# Patient Record
Sex: Male | Born: 1990 | Race: Black or African American | Hispanic: No | Marital: Single | Smoking: Former smoker
Health system: Southern US, Community
[De-identification: ages and names within clinical notes are randomized; demographics above are authoritative.]

## PROBLEM LIST (undated history)

## (undated) DIAGNOSIS — S62316A Displaced fracture of base of fifth metacarpal bone, right hand, initial encounter for closed fracture: Secondary | ICD-10-CM

## (undated) DIAGNOSIS — Z789 Other specified health status: Secondary | ICD-10-CM

## (undated) HISTORY — PX: NO PAST SURGERIES: SHX2092

---
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
## 2010-11-06 LAB — GC/CHLAMYDIA PROBE AMP, GENITAL
Chlamydia, DNA Probe: NEGATIVE
GC Probe Amp, Genital: NEGATIVE

## 2010-11-06 LAB — RPR: RPR Ser Ql: NONREACTIVE

## 2010-11-08 LAB — RAPID STREP SCREEN (MED CTR MEBANE ONLY): Streptococcus, Group A Screen (Direct): POSITIVE — AB

## 2018-02-15 ENCOUNTER — Other Ambulatory Visit: Payer: Self-pay

## 2018-02-15 ENCOUNTER — Encounter (HOSPITAL_BASED_OUTPATIENT_CLINIC_OR_DEPARTMENT_OTHER): Payer: Self-pay | Admitting: Emergency Medicine

## 2018-02-15 ENCOUNTER — Emergency Department (HOSPITAL_BASED_OUTPATIENT_CLINIC_OR_DEPARTMENT_OTHER)
Admission: EM | Admit: 2018-02-15 | Discharge: 2018-02-15 | Disposition: A | Discharge status: Home / Self Care | Payer: Self-pay | Attending: Emergency Medicine | Admitting: Emergency Medicine

## 2018-02-15 ENCOUNTER — Emergency Department (HOSPITAL_BASED_OUTPATIENT_CLINIC_OR_DEPARTMENT_OTHER): Payer: Self-pay

## 2018-02-15 DIAGNOSIS — Y929 Unspecified place or not applicable: Secondary | ICD-10-CM | POA: Insufficient documentation

## 2018-02-15 DIAGNOSIS — W2209XA Striking against other stationary object, initial encounter: Secondary | ICD-10-CM | POA: Insufficient documentation

## 2018-02-15 DIAGNOSIS — S62336A Displaced fracture of neck of fifth metacarpal bone, right hand, initial encounter for closed fracture: Secondary | ICD-10-CM | POA: Insufficient documentation

## 2018-02-15 DIAGNOSIS — Y939 Activity, unspecified: Secondary | ICD-10-CM | POA: Insufficient documentation

## 2018-02-15 DIAGNOSIS — Y999 Unspecified external cause status: Secondary | ICD-10-CM | POA: Insufficient documentation

## 2018-02-15 MED ORDER — HYDROCODONE-ACETAMINOPHEN 5-325 MG PO TABS: 1.0000 | ORAL_TABLET | Freq: Four times a day (QID) | ORAL | 0 refills | Status: DC | PRN

## 2018-02-15 MED ORDER — HYDROCODONE-ACETAMINOPHEN 5-325 MG PO TABS
1.0000 | ORAL_TABLET | Freq: Once | ORAL | Status: AC
  Administered 2018-02-15: 1 via ORAL
  Filled 2018-02-15: qty 1

## 2018-02-15 MED ORDER — ONDANSETRON 8 MG PO TBDP
8.0000 mg | ORAL_TABLET | Freq: Once | ORAL | Status: AC
Start: 2018-02-15 — End: 2018-02-15
  Administered 2018-02-15: 8 mg via ORAL
  Filled 2018-02-15: qty 1

## 2018-02-15 MED ORDER — NAPROXEN 250 MG PO TABS
500.0000 mg | ORAL_TABLET | Freq: Once | ORAL | Status: AC
  Administered 2018-02-15: 500 mg via ORAL
  Filled 2018-02-15: qty 2

## 2018-02-15 NOTE — ED Notes (Signed)
PMS intact before and after. Pt tolerated well. All questions answered. 
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
## 2018-02-15 NOTE — ED Notes (Signed)
Patient transported to X-ray 

## 2018-02-15 NOTE — ED Provider Notes (Signed)
[facility] Provider Note: Larry Le Larry Macintyre, MD, FACEP  CSN: [id number] MRN: [id number] ARRIVAL: 02/15/18 at 0528 ROOM: [facility]07/[facility]07   CHIEF COMPLAINT  Hand Injury   HISTORY OF PRESENT ILLNESS  02/15/18 5:36 AM Larry Le Larry Le is a 28 y.o. male who punched a door in anger about an hour ago.  He is now having severe pain about his right fourth and fifth fifth metacarpals with overlying swelling and a superficial abrasion.  He is unable to move his right fourth and fifth fingers due to pain but sensation remains intact.  He denies other injuries.  He states his tetanus is up-to-date.   History reviewed. No pertinent past medical history.  History reviewed. No pertinent surgical history.  No family history on file.  Social History   Tobacco Use  . Smoking status: Not on file  Substance Use Topics  . Alcohol use: Not on file  . Drug use: Not on file    Prior to Admission medications   Medication Sig Start Date End Date Taking? Authorizing Provider  HYDROcodone-acetaminophen (NORCO) 5-325 MG tablet Take 1 tablet by mouth every 6 (six) hours as needed (for pain). 02/15/18   Naftuli Dalsanto, MD    Allergies Penicillins   REVIEW OF SYSTEMS  Negative except as noted here or in the History of Present Illness.   PHYSICAL EXAMINATION  Initial Vital Signs Height 5\' 11"  (1.803 m), weight 104.3 kg, SpO2 99 %.  Examination General: Well-developed, well-nourished male in no acute distress; appearance consistent with age of record HENT: normocephalic; atraumatic Eyes: Normal appearance Neck: supple Heart: regular rate and rhythm Lungs: clear to auscultation bilaterally Abdomen: soft; nondistended; nontender; bowel sounds present Extremities: Swelling and tenderness overlying the distal right fourth and fifth metacarpals with decreased range of motion of the right fourth and fifth fingers, fingers neurovascularly intact distally Neurologic: Awake, alert and oriented; motor  function intact in all extremities and symmetric; no facial droop Skin: Warm and dry; superficial abrasion overlying distal right fifth metacarpal Psychiatric: Normal mood and affect   RESULTS  Summary of this visit's results, reviewed by myself:   EKG Interpretation  Date/Time:    Ventricular Rate:    PR Interval:    QRS Duration:   QT Interval:    QTC Calculation:   R Axis:     Text Interpretation:        Laboratory Studies: No results found for this or any previous visit (from the past 24 hour(s)). Imaging Studies: Dg Hand Complete Right  Result Date: 02/15/2018 CLINICAL DATA:  Punched metal door EXAM: RIGHT HAND - COMPLETE 3+ VIEW COMPARISON:  None. FINDINGS: There is an oblique fracture of the distal fifth metacarpal with volar angulation. Minimal displacement. Moderate soft tissue swelling. IMPRESSION: Oblique fracture of the distal fifth metacarpal with volar angulation. Electronically Signed   By: Deatra RobinsonKevin  Herman M.D.   On: 02/15/2018 05:59    ED COURSE and MDM  Nursing notes and initial vitals signs, including pulse oximetry, reviewed.  Vitals:   02/15/18 0529 02/15/18 0531 02/15/18 0535  BP:   107/72  Pulse:   89  Resp:   18  Temp:   99.2 F (37.3 C)  TempSrc:   Oral  SpO2: 99%  100%  Weight:  104.3 kg   Height:  5\' 11"  (1.803 m)    Patient placed in right ulnar gutter splint.  Will refer to hand surgery for definitive treatment.  PROCEDURES    ED DIAGNOSES  ICD-10-CM   1. Closed displaced fracture of neck of fifth metacarpal bone of right hand, initial encounter S62.336A        Darleny Sem, Jonny RuizJohn, MD 02/15/18 [phone]

## 2018-02-15 NOTE — ED Notes (Signed)
Pt understood dc material. NAD Noted. Script sent to 24 hour pharmacy electronically.

## 2018-02-17 ENCOUNTER — Other Ambulatory Visit: Payer: Self-pay | Admitting: Orthopedic Surgery

## 2018-02-17 ENCOUNTER — Encounter (HOSPITAL_BASED_OUTPATIENT_CLINIC_OR_DEPARTMENT_OTHER): Payer: Self-pay | Admitting: *Deleted

## 2018-02-17 ENCOUNTER — Other Ambulatory Visit: Payer: Self-pay

## 2018-02-17 NOTE — Progress Notes (Signed)
Spoke with Dois Npo after midnight arrive 915 am 02-18-18 [facility] to take sip of water: hydrocodone prn Patient aware driver needed and will arrange driver Surgery odrers need 2nd sign in epic No labs needed

## 2018-02-18 ENCOUNTER — Encounter (HOSPITAL_BASED_OUTPATIENT_CLINIC_OR_DEPARTMENT_OTHER): Payer: Self-pay | Admitting: *Deleted

## 2018-02-18 ENCOUNTER — Ambulatory Visit (HOSPITAL_BASED_OUTPATIENT_CLINIC_OR_DEPARTMENT_OTHER): Payer: Self-pay | Admitting: Anesthesiology

## 2018-02-18 ENCOUNTER — Encounter (HOSPITAL_BASED_OUTPATIENT_CLINIC_OR_DEPARTMENT_OTHER)
Admission: RE | Disposition: A | Discharge status: Home / Self Care | Payer: Self-pay | Source: Home / Self Care | Attending: Orthopedic Surgery

## 2018-02-18 ENCOUNTER — Ambulatory Visit (HOSPITAL_BASED_OUTPATIENT_CLINIC_OR_DEPARTMENT_OTHER)
Admission: RE | Admit: 2018-02-18 | Discharge: 2018-02-18 | Disposition: A | Discharge status: Home / Self Care | Payer: Self-pay | Attending: Orthopedic Surgery | Admitting: Orthopedic Surgery

## 2018-02-18 ENCOUNTER — Other Ambulatory Visit: Payer: Self-pay

## 2018-02-18 DIAGNOSIS — X58XXXA Exposure to other specified factors, initial encounter: Secondary | ICD-10-CM | POA: Insufficient documentation

## 2018-02-18 DIAGNOSIS — Z880 Allergy status to penicillin: Secondary | ICD-10-CM | POA: Insufficient documentation

## 2018-02-18 DIAGNOSIS — S62316A Displaced fracture of base of fifth metacarpal bone, right hand, initial encounter for closed fracture: Secondary | ICD-10-CM | POA: Insufficient documentation

## 2018-02-18 HISTORY — DX: Displaced fracture of base of fifth metacarpal bone, right hand, initial encounter for closed fracture: S62.316A

## 2018-02-18 HISTORY — PX: CLOSED REDUCTION METACARPAL WITH PERCUTANEOUS PINNING: SHX5613

## 2018-02-18 HISTORY — DX: Other specified health status: Z78.9

## 2018-02-18 SURGERY — CLOSED REDUCTION, FRACTURE, METACARPAL BONE, WITH PERCUTANEOUS PINNING
Anesthesia: General | Laterality: Right

## 2018-02-18 MED ORDER — OXYCODONE HCL 5 MG PO TABS
ORAL_TABLET | ORAL | Status: AC
  Filled 2018-02-18: qty 1

## 2018-02-18 MED ORDER — FENTANYL CITRATE (PF) 100 MCG/2ML IJ SOLN
25.0000 ug | INTRAMUSCULAR | Status: DC | PRN
  Filled 2018-02-18: qty 1

## 2018-02-18 MED ORDER — PROPOFOL 10 MG/ML IV BOLUS
INTRAVENOUS | Status: DC | PRN
  Administered 2018-02-18: 200 mg via INTRAVENOUS
  Administered 2018-02-18: 50 mg via INTRAVENOUS

## 2018-02-18 MED ORDER — OXYCODONE-ACETAMINOPHEN 5-325 MG PO TABS: 1.0000 | ORAL_TABLET | ORAL | 0 refills | Status: AC | PRN

## 2018-02-18 MED ORDER — MIDAZOLAM HCL 2 MG/2ML IJ SOLN
INTRAMUSCULAR | Status: AC
  Filled 2018-02-18: qty 2

## 2018-02-18 MED ORDER — CHLORHEXIDINE GLUCONATE 4 % EX LIQD
60.0000 mL | Freq: Once | CUTANEOUS | Status: DC
  Filled 2018-02-18: qty 118

## 2018-02-18 MED ORDER — DEXAMETHASONE SODIUM PHOSPHATE 10 MG/ML IJ SOLN
INTRAMUSCULAR | Status: DC | PRN
  Administered 2018-02-18: 10 mg via INTRAVENOUS

## 2018-02-18 MED ORDER — LACTATED RINGERS IV SOLN
INTRAVENOUS | Status: DC
  Administered 2018-02-18 (×2): via INTRAVENOUS
  Filled 2018-02-18: qty 1000

## 2018-02-18 MED ORDER — MIDAZOLAM HCL 2 MG/2ML IJ SOLN
INTRAMUSCULAR | Status: DC | PRN
  Administered 2018-02-18: 2 mg via INTRAVENOUS

## 2018-02-18 MED ORDER — LIDOCAINE 2% (20 MG/ML) 5 ML SYRINGE
INTRAMUSCULAR | Status: DC | PRN
  Administered 2018-02-18: 60 mg via INTRAVENOUS

## 2018-02-18 MED ORDER — PROMETHAZINE HCL 25 MG/ML IJ SOLN
6.2500 mg | INTRAMUSCULAR | Status: DC | PRN
  Filled 2018-02-18: qty 1

## 2018-02-18 MED ORDER — KETOROLAC TROMETHAMINE 30 MG/ML IJ SOLN
30.0000 mg | Freq: Once | INTRAMUSCULAR | Status: DC | PRN
  Filled 2018-02-18: qty 1

## 2018-02-18 MED ORDER — KETOROLAC TROMETHAMINE 30 MG/ML IJ SOLN
INTRAMUSCULAR | Status: AC
  Filled 2018-02-18: qty 1

## 2018-02-18 MED ORDER — OXYCODONE HCL 5 MG PO TABS
5.0000 mg | ORAL_TABLET | Freq: Once | ORAL | Status: AC
  Administered 2018-02-18: 5 mg via ORAL
  Filled 2018-02-18: qty 1

## 2018-02-18 MED ORDER — VANCOMYCIN HCL IN DEXTROSE 1-5 GM/200ML-% IV SOLN
INTRAVENOUS | Status: AC
  Filled 2018-02-18: qty 200

## 2018-02-18 MED ORDER — FENTANYL CITRATE (PF) 100 MCG/2ML IJ SOLN
INTRAMUSCULAR | Status: AC
  Filled 2018-02-18: qty 2

## 2018-02-18 MED ORDER — KETOROLAC TROMETHAMINE 30 MG/ML IJ SOLN
INTRAMUSCULAR | Status: DC | PRN
  Administered 2018-02-18: 30 mg via INTRAVENOUS

## 2018-02-18 MED ORDER — ONDANSETRON HCL 4 MG/2ML IJ SOLN
INTRAMUSCULAR | Status: DC | PRN
  Administered 2018-02-18: 4 mg via INTRAVENOUS

## 2018-02-18 MED ORDER — PROPOFOL 10 MG/ML IV BOLUS
INTRAVENOUS | Status: AC
  Filled 2018-02-18: qty 40

## 2018-02-18 MED ORDER — VANCOMYCIN HCL IN DEXTROSE 1-5 GM/200ML-% IV SOLN
1000.0000 mg | INTRAVENOUS | Status: DC
  Filled 2018-02-18: qty 200

## 2018-02-18 MED ORDER — ONDANSETRON HCL 4 MG/2ML IJ SOLN
INTRAMUSCULAR | Status: AC
  Filled 2018-02-18: qty 2

## 2018-02-18 MED ORDER — FENTANYL CITRATE (PF) 100 MCG/2ML IJ SOLN
INTRAMUSCULAR | Status: DC | PRN
  Administered 2018-02-18 (×2): 50 ug via INTRAVENOUS
  Administered 2018-02-18 (×4): 25 ug via INTRAVENOUS

## 2018-02-18 MED ORDER — BUPIVACAINE HCL (PF) 0.25 % IJ SOLN
INTRAMUSCULAR | Status: DC | PRN
  Administered 2018-02-18: 10 mL

## 2018-02-18 SURGICAL SUPPLY — 70 items
2.0 DRILL BIT ×2 IMPLANT
APL SKNCLS STERI-STRIP NONHPOA (GAUZE/BANDAGES/DRESSINGS)
ARTHREX GUIDEWIRE ×1 IMPLANT
BANDAGE ACE 3X5.8 VEL STRL LF (GAUZE/BANDAGES/DRESSINGS) ×3 IMPLANT
BANDAGE ACE 4X5 VEL STRL LF (GAUZE/BANDAGES/DRESSINGS) IMPLANT
BANDAGE ELASTIC 4 VELCRO ST LF (GAUZE/BANDAGES/DRESSINGS) ×2 IMPLANT
BENZOIN TINCTURE PRP APPL 2/3 (GAUZE/BANDAGES/DRESSINGS) IMPLANT
BIT DRILL 3 CANN STRGHT (BIT) ×3
BLADE SURG 15 STRL LF DISP TIS (BLADE) ×1 IMPLANT
BLADE SURG 15 STRL SS (BLADE) ×3
BNDG CMPR 9X4 STRL LF SNTH (GAUZE/BANDAGES/DRESSINGS)
BNDG ELASTIC 2X5.8 VLCR STR LF (GAUZE/BANDAGES/DRESSINGS) IMPLANT
BNDG ESMARK 4X9 LF (GAUZE/BANDAGES/DRESSINGS) IMPLANT
BNDG GAUZE ELAST 4 BULKY (GAUZE/BANDAGES/DRESSINGS) ×2 IMPLANT
CANISTER SUCT 1200ML W/VALVE (MISCELLANEOUS) IMPLANT
CLOSURE WOUND 1/2 X4 (GAUZE/BANDAGES/DRESSINGS)
CORD BIPOLAR FORCEPS 12FT (ELECTRODE) IMPLANT
COVER BACK TABLE 60X90IN (DRAPES) ×3 IMPLANT
COVER WAND RF STERILE (DRAPES) ×6 IMPLANT
CUFF TOURNIQUET SINGLE 18IN (TOURNIQUET CUFF) IMPLANT
DECANTER SPIKE VIAL GLASS SM (MISCELLANEOUS) IMPLANT
DRAPE EXTREMITY T 121X128X90 (DISPOSABLE) ×3 IMPLANT
DRAPE OEC MINIVIEW 54X84 (DRAPES) ×3 IMPLANT
DRAPE SHEET LG 3/4 BI-LAMINATE (DRAPES) ×6 IMPLANT
DRAPE SURG 17X23 STRL (DRAPES) ×3 IMPLANT
DRILL COUNTERSINK CANN 3 (BIT) IMPLANT
DURAPREP 26ML APPLICATOR (WOUND CARE) ×3 IMPLANT
GAUZE SPONGE 4X4 12PLY STRL (GAUZE/BANDAGES/DRESSINGS) ×3 IMPLANT
GAUZE XEROFORM 1X8 LF (GAUZE/BANDAGES/DRESSINGS) ×2 IMPLANT
GLOVE BIOGEL M STRL SZ7.5 (GLOVE) ×3 IMPLANT
GLOVE SURG SYN 8.0 (GLOVE) ×6 IMPLANT
GLOVE SURG SYN 8.0 PF PI (GLOVE) ×2 IMPLANT
GOWN STRL REUS W/TWL XL LVL3 (GOWN DISPOSABLE) ×6 IMPLANT
GUIDEWIRE .045XTROC TIP LSR LN (WIRE) IMPLANT
GUIDEWIRE THREADED 0.86MM (WIRE) ×2 IMPLANT
K-WIRE 1.1 (WIRE) ×6
NDL HYPO 25X1 1.5 SAFETY (NEEDLE) IMPLANT
NEEDLE HYPO 25X1 1.5 SAFETY (NEEDLE) IMPLANT
NS IRRIG 1000ML POUR BTL (IV SOLUTION) IMPLANT
PACK BASIN DAY SURGERY FS (CUSTOM PROCEDURE TRAY) ×3 IMPLANT
PAD CAST 3X4 CTTN HI CHSV (CAST SUPPLIES) ×1 IMPLANT
PAD CAST 4YDX4 CTTN HI CHSV (CAST SUPPLIES) IMPLANT
PADDING CAST ABS 3INX4YD NS (CAST SUPPLIES) ×2
PADDING CAST ABS 4INX4YD NS (CAST SUPPLIES) ×2
PADDING CAST ABS COTTON 3X4 (CAST SUPPLIES) IMPLANT
PADDING CAST ABS COTTON 4X4 ST (CAST SUPPLIES) ×1 IMPLANT
PADDING CAST COTTON 3X4 STRL (CAST SUPPLIES) ×3
PADDING CAST COTTON 4X4 STRL (CAST SUPPLIES) ×3
PADDING UNDERCAST 2 STRL (CAST SUPPLIES) ×2
PADDING UNDERCAST 2X4 STRL (CAST SUPPLIES) ×1 IMPLANT
SCREW COMPRESSION 2.5X26MM (Screw) ×2 IMPLANT
SPLINT PLASTER CAST XFAST 4X15 (CAST SUPPLIES) IMPLANT
SPLINT PLASTER XTRA FAST SET 4 (CAST SUPPLIES)
STOCKINETTE 4X48 STRL (DRAPES) ×3 IMPLANT
STRIP CLOSURE SKIN 1/2X4 (GAUZE/BANDAGES/DRESSINGS) IMPLANT
SUCTION FRAZIER HANDLE 10FR (MISCELLANEOUS)
SUCTION TUBE FRAZIER 10FR DISP (MISCELLANEOUS) IMPLANT
SUT ETHILON 4 0 PS 2 18 (SUTURE) ×2 IMPLANT
SUT ETHILON 5 0 PS 2 18 (SUTURE) IMPLANT
SUT MERSILENE 4 0 P 3 (SUTURE) IMPLANT
SUT VIC AB 4-0 P-3 18XBRD (SUTURE) IMPLANT
SUT VIC AB 4-0 P3 18 (SUTURE)
SUT VICRYL RAPIDE 4-0 (SUTURE) IMPLANT
SUT VICRYL RAPIDE 4/0 PS 2 (SUTURE) IMPLANT
SYR 10ML LL (SYRINGE) IMPLANT
SYR BULB 3OZ (MISCELLANEOUS) IMPLANT
TOWEL OR 17X24 6PK STRL BLUE (TOWEL DISPOSABLE) ×3 IMPLANT
TUBE CONNECTING 20'X1/4 (TUBING)
TUBE CONNECTING 20X1/4 (TUBING) IMPLANT
UNDERPAD 30X30 (UNDERPADS AND DIAPERS) ×3 IMPLANT

## 2018-02-18 NOTE — Anesthesia Preprocedure Evaluation (Addendum)
Anesthesia Evaluation  Patient identified by MRN, date of birth, ID band Patient awake    Reviewed: Allergy & Precautions, NPO status , Patient's Chart, lab work & pertinent test results  Airway Mallampati: II  TM Distance: >3 FB Neck ROM: Full    Dental no notable dental hx.    Pulmonary neg pulmonary ROS,    Pulmonary exam normal breath sounds clear to auscultation       Cardiovascular Exercise Tolerance: Good negative cardio ROS Normal cardiovascular exam Rhythm:Regular Rate:Normal     Neuro/Psych negative neurological ROS  negative psych ROS   GI/Hepatic negative GI ROS, Neg liver ROS,   Endo/Other  negative endocrine ROS  Renal/GU negative Renal ROS  negative genitourinary   Musculoskeletal negative musculoskeletal ROS (+)   Abdominal   Peds negative pediatric ROS (+)  Hematology negative hematology ROS (+)   Anesthesia Other Findings   Reproductive/Obstetrics negative OB ROS                            Anesthesia Physical Anesthesia Plan  ASA: I  Anesthesia Plan: General   Post-op Pain Management:    Induction: Intravenous  PONV Risk Score and Plan: 2 and Ondansetron, Dexamethasone and Treatment may vary due to age or medical condition  Airway Management Planned: LMA  Additional Equipment:   Intra-op Plan:   Post-operative Plan: Extubation in OR  Informed Consent: I have reviewed the patients History and Physical, chart, labs and discussed the procedure including the risks, benefits and alternatives for the proposed anesthesia with the patient or authorized representative who has indicated his/her understanding and acceptance.     Dental advisory given  Plan Discussed with: CRNA, Surgeon and Anesthesiologist  Anesthesia Plan Comments:        Anesthesia Quick Evaluation

## 2018-02-18 NOTE — H&P (Signed)
Larry Le is an 28 y.o. male.   Chief Complaint: Right hand pain, swelling, and deformity HPI: Patient is a very pleasant 28 year old male right-hand-dominant status post right hand trauma with displaced intra-articular fracture right small metacarpal distally at the metacarpophalangeal joint  Past Medical History:  Diagnosis Date  . Displaced fracture of base of fifth metacarpal bone, right hand, initial encounter for closed fracture   . Medical history non-contributory     Past Surgical History:  Procedure Laterality Date  . NO PAST SURGERIES      History reviewed. No pertinent family history. Social History:  reports that he has never smoked. He has never used smokeless tobacco. He reports current alcohol use. He reports that he does not use drugs.  Allergies:  Allergies  Allergen Reactions  . Penicillins     rash    Medications Prior to Admission  Medication Sig Dispense Refill  . HYDROcodone-acetaminophen (NORCO) 5-325 MG tablet Take 1 tablet by mouth every 6 (six) hours as needed (for pain). 20 tablet 0    No results found for this or any previous visit (from the past 48 hour(s)). No results found.  Review of Systems  All other systems reviewed and are negative.   Blood pressure 125/80, pulse 62, temperature 97.6 F (36.4 C), temperature source Oral, resp. rate 16, height 5\' 11"  (1.803 m), weight 105.3 kg, SpO2 100 %. Physical Exam  Constitutional: He is oriented to person, place, and time. He appears well-developed and well-nourished.  HENT:  Head: Normocephalic and atraumatic.  Neck: Normal range of motion.  Cardiovascular: Normal rate.  Respiratory: Effort normal.  Musculoskeletal:     Right hand: He exhibits tenderness, bony tenderness, deformity and swelling.     Comments: Displaced right small finger metacarpal fracture distally at the metacarpophalangeal joint  Neurological: He is alert and oriented to person, place, and time.  Skin: Skin is warm.   Psychiatric: He has a normal mood and affect. His behavior is normal. Judgment and thought content normal.     Assessment/Plan 28 year old right-hand-dominant male with displaced intra-articular fracture right small metacarpal distally at the metacarpal phalangeal joint.  Have discussed with the patient in great detail the nature of his current predicament and treatment options.  He wished to proceed with closed reduction pinning versus open reduction internal fixation of this displaced right small metacarpal fracture.  He understands the risks and benefits and the recovery and wishes to proceed  Marlowe Shores, MD 02/18/2018, 12:29 PM

## 2018-02-18 NOTE — Transfer of Care (Signed)
Immediate Anesthesia Transfer of Care Note  Patient: Larry Le  Procedure(s) Performed: Procedure(s) (LRB): CLOSED REDUCTION METACARPAL WITH PERCUTANEOUS PINNING OPEN REDUCTION INTERNAL FIXATION RIGHT  SMALL METACARPAL FRACTURE (Right)  Patient Location: PACU  Anesthesia Type: General  Level of Consciousness: awake, sedated, patient cooperative and responds to stimulation  Airway & Oxygen Therapy: Patient Spontanous Breathing and Patient connected to [place] oxygen  Post-op Assessment: Report given to PACU RN, Post -op Vital signs reviewed and stable and Patient moving all extremities  Post vital signs: Reviewed and stable  Complications: No apparent anesthesia complications

## 2018-02-18 NOTE — Discharge Instructions (Signed)

## 2018-02-18 NOTE — Op Note (Signed)
Please see dictated report [phone]

## 2018-02-18 NOTE — Anesthesia Procedure Notes (Signed)
Procedure Name: LMA Insertion Date/Time: 02/18/2018 12:47 PM Performed by: Tyrone Nine, CRNA Pre-anesthesia Checklist: Patient identified, Emergency Drugs available, Suction available and Patient being monitored Patient Re-evaluated:Patient Re-evaluated prior to induction Oxygen Delivery Method: Circle system utilized Preoxygenation: Pre-oxygenation with 100% oxygen Induction Type: IV induction Ventilation: Mask ventilation without difficulty LMA: LMA inserted LMA Size: 4.0 Number of attempts: 1 Placement Confirmation: breath sounds checked- equal and bilateral,  CO2 detector and positive ETCO2 Tube secured with: Tape Dental Injury: Teeth and Oropharynx as per pre-operative assessment

## 2018-02-18 NOTE — Op Note (Signed)
NAME: Larry Le, Larry Le MEDICAL RECORD DH:[id number] ACCOUNT [id number] DATE OF BIRTH:Nov 28, 1990 FACILITY: [facility] LOCATION: [facility] PHYSICIAN:Maylen Waltermire A. Mina Marble, MD  OPERATIVE REPORT  DATE OF PROCEDURE:  02/18/2018  PREOPERATIVE DIAGNOSIS:  Right small finger intraarticular metacarpal fracture.  POSTOPERATIVE DIAGNOSIS:  Right small finger intraarticular metacarpal fracture.  PROCEDURE:  Open reduction internal fixation using Arthrex 2.5 mm headless screw 26 mm in length.  SURGEON:  Dairl Ponder, MD  ASSISTANT:  None.  ANESTHESIA:  General.  COMPLICATIONS:  No complications.  DRAINS:  No drains.  DESCRIPTION OF PROCEDURE:  The patient was taken to the operating suite after induction of adequate general anesthetic.  Right upper extremity was prepped and draped in the usual sterile fashion.  An Esmarch was used to exsanguinate the limb, and the  tourniquet was inflated to 250 mmHg.  At this point in time, an incision was made over the small metacarpal distally at the metacarpophalangeal joint.  Dissection was carried down to the joint capsule.  A small arthrotomy was made under direct and  fluoroscopic guidance.  The guide pin for the Arthrex 2.5 mm headless screw was placed from distal to proximal across the fracture site after reduction was performed, fluoroscopic imaging to determine adequate placement of the pin.  A 26 mm x 2.5 mm  screw was then placed from distal to proximal across the fracture site to maintain the reduction.  This was evident in all 3 fluoroscopic images.  The wound was then closed after irrigation.  It was closed with 4-0 nylon.  Xeroform, 4 x 4's, fluffs and  an ulnar gutter splint was applied.  The patient tolerated this procedure well and went to recovery room in stable fashion.  LN/NUANCE  D:02/18/2018 T:02/18/2018 JOB:[id number]

## 2018-02-19 ENCOUNTER — Encounter (HOSPITAL_BASED_OUTPATIENT_CLINIC_OR_DEPARTMENT_OTHER): Payer: Self-pay | Admitting: Orthopedic Surgery

## 2018-02-19 NOTE — Anesthesia Postprocedure Evaluation (Signed)
Anesthesia Post Note  Patient: Larry Le  Procedure(s) Performed: CLOSED REDUCTION METACARPAL WITH PERCUTANEOUS PINNING OPEN REDUCTION INTERNAL FIXATION RIGHT  SMALL METACARPAL FRACTURE (Right )     Patient location during evaluation: PACU Anesthesia Type: General Level of consciousness: sedated and patient cooperative Pain management: pain level controlled Vital Signs Assessment: post-procedure vital signs reviewed and stable Respiratory status: spontaneous breathing Cardiovascular status: stable Anesthetic complications: no    Last Vitals:  Vitals:   02/18/18 1515 02/18/18 1543  BP: 115/73 123/79  Pulse: 61 65  Resp: 16 14  Temp: 36.5 C 36.7 C  SpO2: 100% 100%    Last Pain:  Vitals:   02/18/18 1543  TempSrc:   PainSc: 6                  Lewie Loron

## 2019-06-01 ENCOUNTER — Emergency Department (HOSPITAL_COMMUNITY): Payer: Self-pay

## 2019-06-01 ENCOUNTER — Emergency Department (HOSPITAL_COMMUNITY)
Admission: EM | Admit: 2019-06-01 | Discharge: 2019-06-01 | Disposition: A | Discharge status: Home / Self Care | Payer: Self-pay | Attending: Emergency Medicine | Admitting: Emergency Medicine

## 2019-06-01 ENCOUNTER — Other Ambulatory Visit: Payer: Self-pay

## 2019-06-01 ENCOUNTER — Encounter (HOSPITAL_COMMUNITY): Payer: Self-pay | Admitting: Emergency Medicine

## 2019-06-01 DIAGNOSIS — Y9389 Activity, other specified: Secondary | ICD-10-CM | POA: Insufficient documentation

## 2019-06-01 DIAGNOSIS — W500XXA Accidental hit or strike by another person, initial encounter: Secondary | ICD-10-CM | POA: Insufficient documentation

## 2019-06-01 DIAGNOSIS — S8991XA Unspecified injury of right lower leg, initial encounter: Secondary | ICD-10-CM | POA: Insufficient documentation

## 2019-06-01 DIAGNOSIS — Y9289 Other specified places as the place of occurrence of the external cause: Secondary | ICD-10-CM | POA: Insufficient documentation

## 2019-06-01 DIAGNOSIS — Y999 Unspecified external cause status: Secondary | ICD-10-CM | POA: Insufficient documentation

## 2019-06-01 MED ORDER — IBUPROFEN 600 MG PO TABS
600.0000 mg | ORAL_TABLET | Freq: Four times a day (QID) | ORAL | 0 refills | Status: DC | PRN
Start: 2019-06-01 — End: 2021-09-12

## 2019-06-01 MED ORDER — DICLOFENAC SODIUM 1 % EX GEL: 4.0000 g | Freq: Four times a day (QID) | CUTANEOUS | 2 refills | Status: DC
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
## 2019-06-01 NOTE — ED Notes (Signed)
An After Visit Summary was printed and given to the patient. Discharge instructions given and no further questions at this time.  

## 2019-06-01 NOTE — Discharge Instructions (Addendum)
You have been seen today for a right knee injury. There were no acute abnormalities on the x-rays, including no sign of fracture or dislocation, however, there could be injuries to the soft tissues, such as the ligaments or tendons that are not seen on xrays. There could also be what are called occult fractures that are small fractures not seen on xray. Antiinflammatory medications: Take 600 mg of ibuprofen every 6 hours or 440 mg (over the counter dose) to 500 mg (prescription dose) of naproxen every 12 hours for the next 3 days. After this time, these medications may be used as needed for pain. Take these medications with food to avoid upset stomach. Choose only one of these medications, do not take them together. Acetaminophen (generic for Tylenol): Should you continue to have additional pain while taking the ibuprofen or naproxen, you may add in acetaminophen as needed. Your daily total maximum amount of acetaminophen from all sources should be limited to 4000mg /day for persons without liver problems, or 2000mg /day for those with liver problems. Diclofenac gel: This is a topical anti-inflammatory medication and can be applied directly to the painful region.  Do not use on the face or genitals.  This medication may be used as an alternative to oral anti-inflammatory medications, such as ibuprofen or naproxen. Ice: May apply ice to the area over the next 24 hours for 15 minutes at a time to reduce swelling. Elevation: Keep the extremity elevated as often as possible to reduce pain and inflammation. Support: Wear the knee immobilizer for support and comfort. Wear this until pain resolves.  We recommend remaining nonweightbearing until told otherwise by the orthopedic specialist. Follow up: Follow-up with the orthopedic specialist as soon as possible.  Call to make an appointment. Return: Return to the ED for numbness, weakness, increasing pain, overall worsening symptoms, loss of function, or if symptoms  are not improving, you have tried to follow up with the orthopedic specialist, and have been unable to do so.  For prescription assistance, may try using prescription discount sites or apps, such as goodrx.com or Good Rx smart phone app.

## 2019-06-15 ENCOUNTER — Ambulatory Visit: Payer: Self-pay | Admitting: Orthopaedic Surgery

## 2019-06-15 ENCOUNTER — Ambulatory Visit (INDEPENDENT_AMBULATORY_CARE_PROVIDER_SITE_OTHER): Payer: Self-pay | Admitting: Orthopaedic Surgery

## 2019-06-15 ENCOUNTER — Other Ambulatory Visit: Payer: Self-pay

## 2019-06-15 ENCOUNTER — Encounter: Payer: Self-pay | Admitting: Orthopaedic Surgery

## 2019-06-15 VITALS — Ht 71.0 in | Wt 240.0 lb

## 2019-06-15 DIAGNOSIS — M25361 Other instability, right knee: Secondary | ICD-10-CM

## 2019-06-15 DIAGNOSIS — M25561 Pain in right knee: Secondary | ICD-10-CM

## 2019-06-15 DIAGNOSIS — M79661 Pain in right lower leg: Secondary | ICD-10-CM
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
## 2019-06-16 ENCOUNTER — Other Ambulatory Visit: Payer: Self-pay

## 2019-06-17 ENCOUNTER — Telehealth: Payer: Self-pay | Admitting: *Deleted

## 2019-06-17 ENCOUNTER — Ambulatory Visit (HOSPITAL_COMMUNITY): Payer: Self-pay | Attending: Surgery

## 2019-06-17 NOTE — Telephone Encounter (Signed)
Pt is scheduled today 06/17/19 for Ultrasound Vascular (DVT) at [facility] and vascular at 3:00pm, pt is to arrive at 2:45pm. Pt is to go to Entrance C of [address]   I called and left message for pt to return call for appt informaiton

## 2019-06-22 ENCOUNTER — Telehealth: Payer: Self-pay | Admitting: Orthopaedic Surgery

## 2019-06-22 ENCOUNTER — Ambulatory Visit: Payer: Self-pay | Admitting: Orthopaedic Surgery

## 2019-06-22 NOTE — Telephone Encounter (Signed)
Called patient left message to return call concerning MRI test he was suppose to have for Dr Ophelia Charter. Patient was also scheduled for another test with another provider. Will wait for patient to call back

## 2019-06-29 ENCOUNTER — Ambulatory Visit
Admission: RE | Admit: 2019-06-29 | Discharge: 2019-06-29 | Disposition: A | Discharge status: Home / Self Care | Payer: Self-pay | Source: Ambulatory Visit | Attending: Surgery | Admitting: Surgery

## 2019-06-29 ENCOUNTER — Ambulatory Visit: Payer: Self-pay | Admitting: Orthopaedic Surgery

## 2019-06-29 ENCOUNTER — Other Ambulatory Visit: Payer: Self-pay

## 2019-06-29 DIAGNOSIS — M25361 Other instability, right knee: Secondary | ICD-10-CM

## 2019-06-29 DIAGNOSIS — M25561 Pain in right knee: Secondary | ICD-10-CM

## 2019-07-05 ENCOUNTER — Telehealth: Payer: Self-pay | Admitting: Orthopaedic Surgery

## 2019-07-05 NOTE — Telephone Encounter (Signed)
Please advise 

## 2019-07-05 NOTE — Telephone Encounter (Signed)
Ucall, ACL tear and medial meniscal tear, we can discuss surgery options on ROV thanks

## 2019-07-05 NOTE — Telephone Encounter (Signed)
Patient called asked if he can get a call back concerning the results of the MRI? The number to contact patient is [phone]

## 2019-07-05 NOTE — Telephone Encounter (Signed)
I called patient and advised. Earlier appt made for MRI review.

## 2019-07-06 ENCOUNTER — Ambulatory Visit (INDEPENDENT_AMBULATORY_CARE_PROVIDER_SITE_OTHER): Payer: Self-pay | Admitting: Orthopaedic Surgery

## 2019-07-06 ENCOUNTER — Encounter: Payer: Self-pay | Admitting: Orthopaedic Surgery

## 2019-07-06 DIAGNOSIS — S83511D Sprain of anterior cruciate ligament of right knee, subsequent encounter: Secondary | ICD-10-CM

## 2019-07-06 DIAGNOSIS — S83519A Sprain of anterior cruciate ligament of unspecified knee, initial encounter: Secondary | ICD-10-CM | POA: Insufficient documentation
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
## 2019-07-21 ENCOUNTER — Ambulatory Visit: Payer: Self-pay | Admitting: Orthopaedic Surgery

## 2019-08-04 ENCOUNTER — Ambulatory Visit: Payer: Self-pay | Admitting: Orthopaedic Surgery

## 2020-11-06 ENCOUNTER — Encounter (HOSPITAL_COMMUNITY): Payer: Self-pay | Admitting: *Deleted

## 2020-11-06 ENCOUNTER — Emergency Department (HOSPITAL_COMMUNITY)
Admission: EM | Admit: 2020-11-06 | Discharge: 2020-11-06 | Disposition: A | Discharge status: Home / Self Care | Payer: No Typology Code available for payment source | Attending: Student | Admitting: Student

## 2020-11-06 ENCOUNTER — Emergency Department (HOSPITAL_COMMUNITY): Payer: No Typology Code available for payment source

## 2020-11-06 DIAGNOSIS — Y92410 Unspecified street and highway as the place of occurrence of the external cause: Secondary | ICD-10-CM | POA: Insufficient documentation

## 2020-11-06 DIAGNOSIS — S060X1A Concussion with loss of consciousness of 30 minutes or less, initial encounter: Secondary | ICD-10-CM | POA: Diagnosis not present

## 2020-11-06 DIAGNOSIS — M79604 Pain in right leg: Secondary | ICD-10-CM | POA: Insufficient documentation

## 2020-11-06 DIAGNOSIS — Z23 Encounter for immunization: Secondary | ICD-10-CM | POA: Insufficient documentation

## 2020-11-06 DIAGNOSIS — S80211A Abrasion, right knee, initial encounter: Secondary | ICD-10-CM | POA: Diagnosis not present

## 2020-11-06 DIAGNOSIS — Z20822 Contact with and (suspected) exposure to covid-19: Secondary | ICD-10-CM | POA: Diagnosis not present

## 2020-11-06 DIAGNOSIS — S0990XA Unspecified injury of head, initial encounter: Secondary | ICD-10-CM | POA: Diagnosis present

## 2020-11-06 LAB — RESP PANEL BY RT-PCR (FLU A&B, COVID) ARPGX2
Influenza A by PCR: NEGATIVE
Influenza B by PCR: NEGATIVE
SARS Coronavirus 2 by RT PCR: NEGATIVE

## 2020-11-06 LAB — COMPREHENSIVE METABOLIC PANEL
ALT: 22 U/L (ref 0–44)
AST: 19 U/L (ref 15–41)
Albumin: 3.6 g/dL (ref 3.5–5.0)
Alkaline Phosphatase: 49 U/L (ref 38–126)
Anion gap: 6 (ref 5–15)
BUN: 10 mg/dL (ref 6–20)
CO2: 25 mmol/L (ref 22–32)
Calcium: 8.7 mg/dL — ABNORMAL LOW (ref 8.9–10.3)
Chloride: 105 mmol/L (ref 98–111)
Creatinine, Ser: 1.06 mg/dL (ref 0.61–1.24)
GFR, Estimated: >60 mL/min (ref >60)
Glucose, Bld: 83 mg/dL (ref 70–99)
Potassium: 3.7 mmol/L (ref 3.5–5.1)
Sodium: 136 mmol/L (ref 135–145)
Total Bilirubin: 0.6 mg/dL (ref 0.3–1.2)
Total Protein: 6.6 g/dL (ref 6.5–8.1)

## 2020-11-06 LAB — SAMPLE TO BLOOD BANK

## 2020-11-06 LAB — URINALYSIS, ROUTINE W REFLEX MICROSCOPIC
Bilirubin Urine: NEGATIVE
Glucose, UA: NEGATIVE mg/dL
Hgb urine dipstick: NEGATIVE
Ketones, ur: NEGATIVE mg/dL
Leukocytes,Ua: NEGATIVE
Nitrite: NEGATIVE
Protein, ur: NEGATIVE mg/dL
Specific Gravity, Urine: 1.009 (ref 1.005–1.030)
pH: 6 (ref 5.0–8.0)

## 2020-11-06 LAB — CBC
HCT: 41.2 % (ref 39.0–52.0)
Hemoglobin: 13.8 g/dL (ref 13.0–17.0)
MCH: 28.3 pg (ref 26.0–34.0)
MCHC: 33.5 g/dL (ref 30.0–36.0)
MCV: 84.6 fL (ref 80.0–100.0)
Platelets: 263 10*3/uL (ref 150–400)
RBC: 4.87 MIL/uL (ref 4.22–5.81)
RDW: 14.1 % (ref 11.5–15.5)
WBC: 4.2 10*3/uL (ref 4.0–10.5)
nRBC: 0 % (ref 0.0–0.2)

## 2020-11-06 LAB — ETHANOL: Alcohol, Ethyl (B): <10 mg/dL (ref <10)

## 2020-11-06 LAB — LACTIC ACID, PLASMA: Lactic Acid, Venous: 1 mmol/L (ref 0.5–1.9)

## 2020-11-06 LAB — I-STAT CHEM 8, ED
BUN: 10 mg/dL (ref 6–20)
Calcium, Ion: 1.13 mmol/L — ABNORMAL LOW (ref 1.15–1.40)
Chloride: 104 mmol/L (ref 98–111)
Creatinine, Ser: 1 mg/dL (ref 0.61–1.24)
Glucose, Bld: 81 mg/dL (ref 70–99)
HCT: 41 % (ref 39.0–52.0)
Hemoglobin: 13.9 g/dL (ref 13.0–17.0)
Potassium: 3.9 mmol/L (ref 3.5–5.1)
Sodium: 139 mmol/L (ref 135–145)
TCO2: 25 mmol/L (ref 22–32)

## 2020-11-06 LAB — PROTIME-INR
INR: 1 (ref 0.8–1.2)
Prothrombin Time: 13.4 seconds (ref 11.4–15.2)

## 2020-11-06 MED ORDER — DIPHENHYDRAMINE HCL 50 MG/ML IJ SOLN
25.0000 mg | Freq: Once | INTRAMUSCULAR | Status: AC
  Administered 2020-11-06: 25 mg via INTRAVENOUS
  Filled 2020-11-06: qty 1

## 2020-11-06 MED ORDER — PROCHLORPERAZINE EDISYLATE 10 MG/2ML IJ SOLN
10.0000 mg | Freq: Once | INTRAMUSCULAR | Status: AC
  Administered 2020-11-06: 10 mg via INTRAVENOUS
  Filled 2020-11-06: qty 2

## 2020-11-06 MED ORDER — LACTATED RINGERS IV BOLUS
1000.0000 mL | Freq: Once | INTRAVENOUS | Status: AC
  Administered 2020-11-06: 1000 mL via INTRAVENOUS

## 2020-11-06 MED ORDER — TETANUS-DIPHTH-ACELL PERTUSSIS 5-2.5-18.5 LF-MCG/0.5 IM SUSY
0.5000 mL | PREFILLED_SYRINGE | Freq: Once | INTRAMUSCULAR | Status: AC
  Administered 2020-11-06: 0.5 mL via INTRAMUSCULAR

## 2020-11-06 MED ORDER — ACETAMINOPHEN 500 MG PO TABS
1000.0000 mg | ORAL_TABLET | Freq: Once | ORAL | Status: AC
  Administered 2020-11-06: 1000 mg via ORAL
  Filled 2020-11-06: qty 2

## 2020-11-06 MED ORDER — FENTANYL CITRATE PF 50 MCG/ML IJ SOSY
50.0000 ug | PREFILLED_SYRINGE | Freq: Once | INTRAMUSCULAR | Status: AC
  Administered 2020-11-06: 50 ug via INTRAVENOUS

## 2020-11-06 MED ORDER — KETOROLAC TROMETHAMINE 15 MG/ML IJ SOLN
15.0000 mg | Freq: Once | INTRAMUSCULAR | Status: AC
  Administered 2020-11-06: 15 mg via INTRAVENOUS
  Filled 2020-11-06: qty 1
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
## 2020-11-06 NOTE — Discharge Instructions (Addendum)
All your testing today was reassuring but you likely did suffer a concussion.  We found no traumatic injuries on your images.  You will be sore over the next few days, likely more sore tomorrow than you are today.  You can take 600 mg of ibuprofen and 1000 mg of Tylenol, alternating every 3 hours.  Follow-up with your primary care doctor if your pain does not improve.  Return to the emergency department with chest pain, shortness of breath, or dizziness.  Refer to the attached instructions regarding concussion care.
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
## 2020-12-08 IMAGING — MR MR KNEE*R* W/O CM
6 series · 40 of 40 positions shown · non-contrast
Comparison: Plain films right knee 06/01/2019.

CLINICAL DATA: Right knee pain since 06/01/2019 the patient
suffered a fall on stairs. Subsequent encounter.

EXAM:
MRI OF THE RIGHT KNEE WITHOUT CONTRAST
TECHNIQUE: Multiplanar, multisequence MR imaging of the knee was performed. No
intravenous contrast was administered.

[Series 7: T2 fat-sat · axial · right · 4.0mm · 0.66mm/px · z∈[-69,+97]mm · 6 of 39 slices shown (1 of 3)]
[im 1/39]
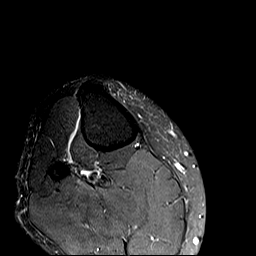
[im 8/39]
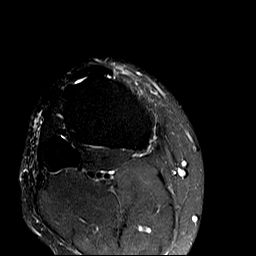
[im 16/39]
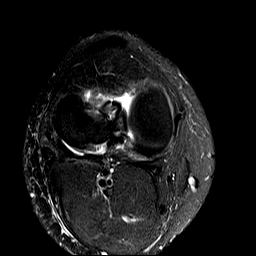
[im 23/39]
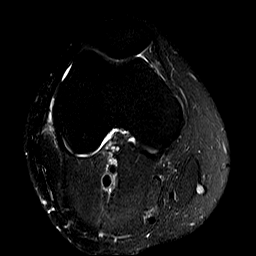
[im 31/39]
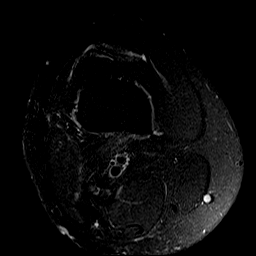
[im 39/39]
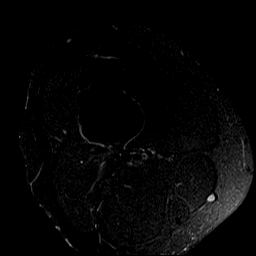

[Series 8: T2 fat-sat · coronal · right · 3.0mm · 0.56mm/px · 8 of 45 slices shown (2 of 3)]
[im 1/45]
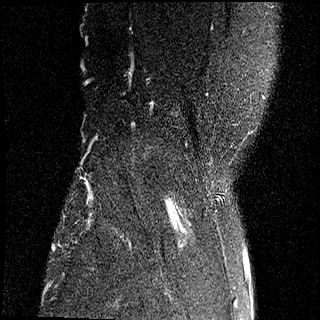
[im 7/45]
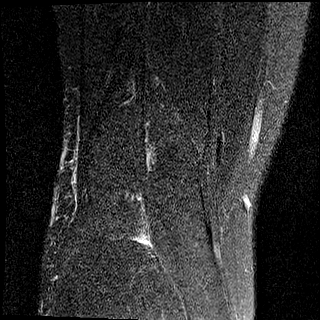
[im 13/45]
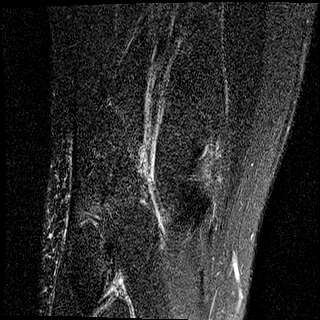
[im 19/45]
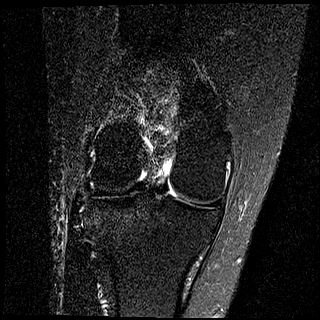
[im 26/45]
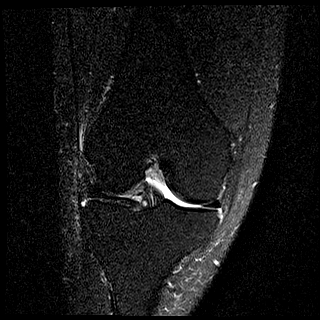
[im 32/45]
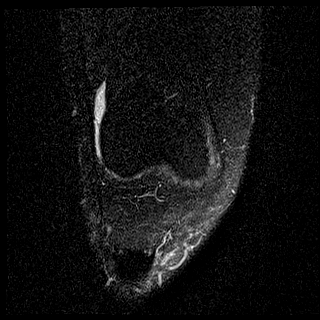
[im 38/45]
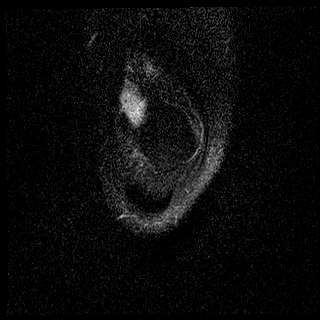
[im 45/45]
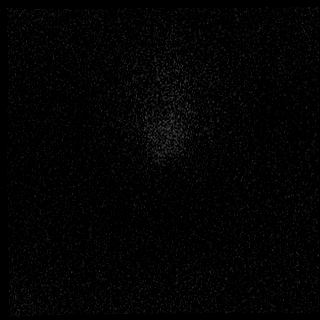

[Series 9: T1 · coronal · right · 3.0mm · 0.56mm/px · 8 of 45 slices shown]
[im 1/45]
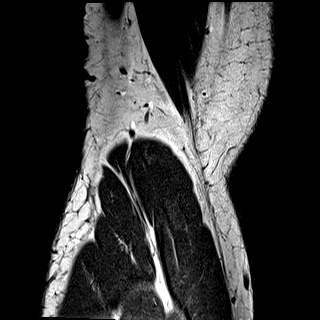
[im 7/45]
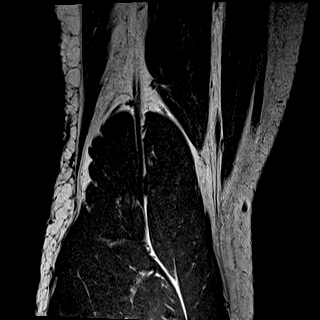
[im 13/45]
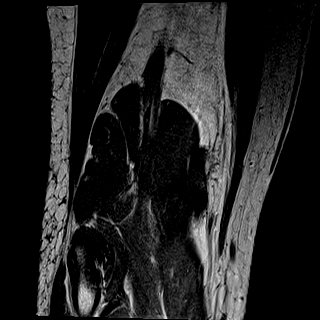
[im 19/45]
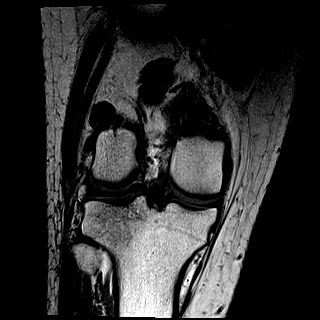
[im 26/45]
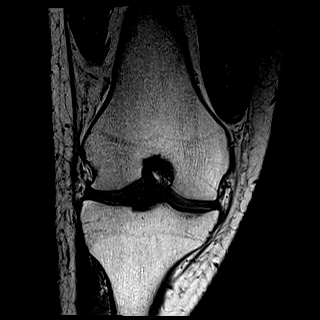
[im 32/45]
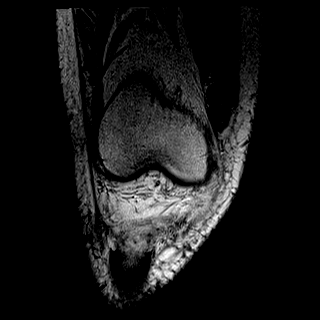
[im 38/45]
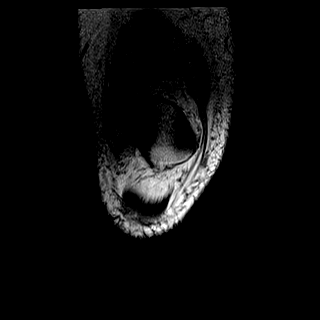
[im 45/45]
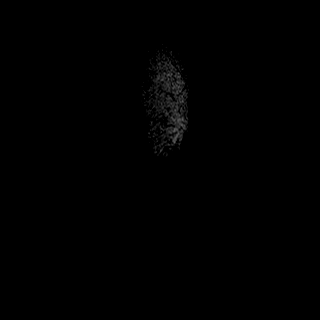

[Series 10: PD fat-sat · coronal · right · 3.0mm · 0.70mm/px · 8 of 45 slices shown (1 of 2)]
[im 1/45]
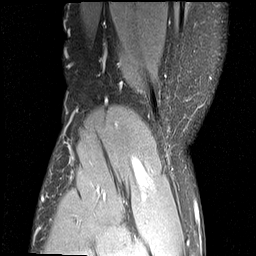
[im 7/45]
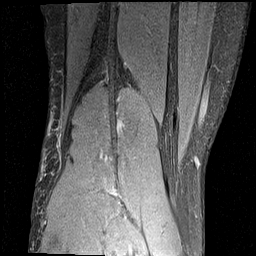
[im 13/45]
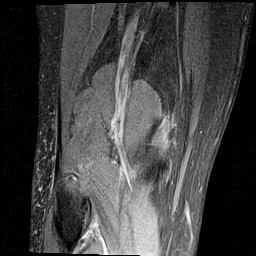
[im 19/45]
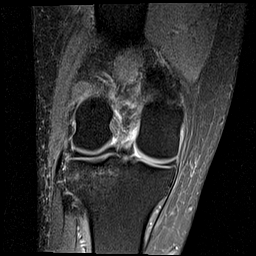
[im 26/45]
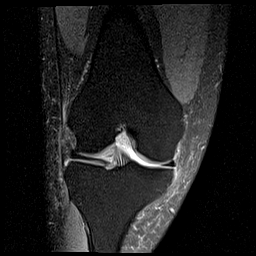
[im 32/45]
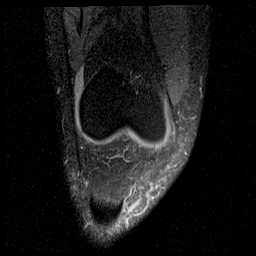
[im 38/45]
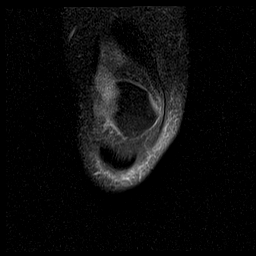
[im 45/45]
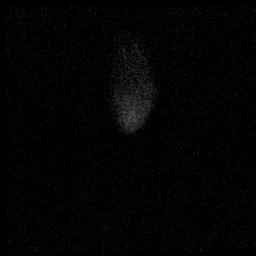

[Series 11: PD fat-sat · sagittal · right · 3.2mm · 0.70mm/px · 5 of 29 slices shown (2 of 2)]
[im 1/29]
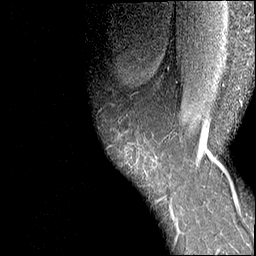
[im 8/29]
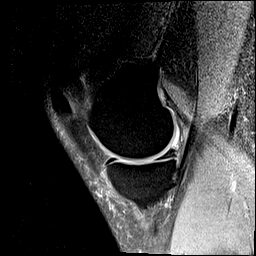
[im 15/29]
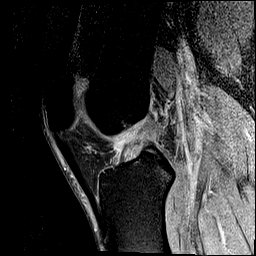
[im 22/29]
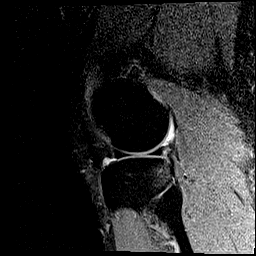
[im 29/29]
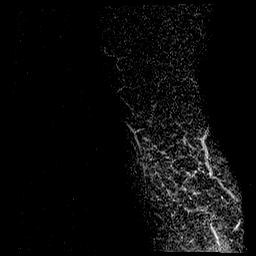

[Series 12: T2 fat-sat · sagittal · right · 3.2mm · 0.56mm/px · 5 of 29 slices shown (3 of 3)]
[im 1/29]
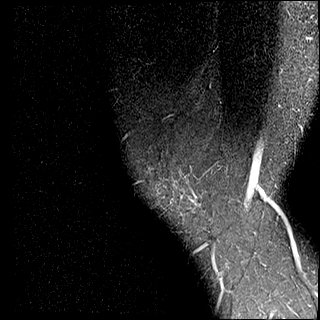
[im 8/29]
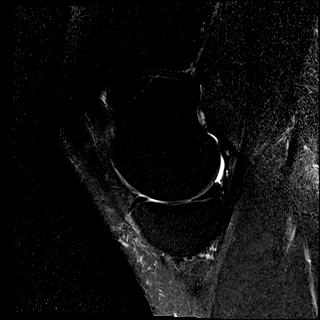
[im 15/29]
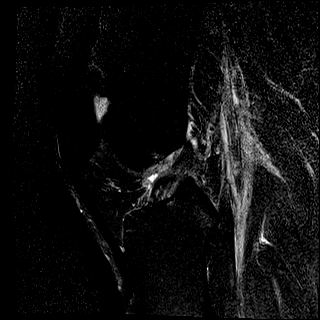
[im 22/29]
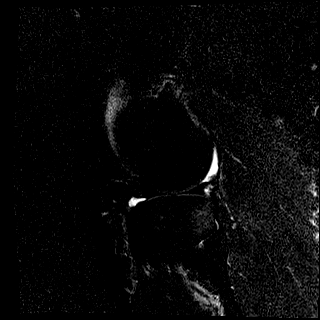
[im 29/29]
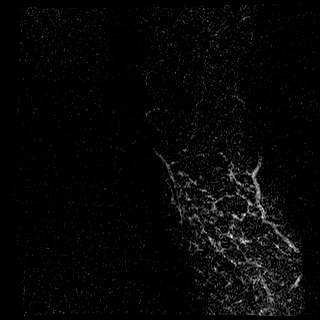

[40 of 40 positions shown; findings below may reference images not displayed]

FINDINGS: MENISCI

Medial meniscus: A horizontal tear near the junction of the
posterior horn and body reaches the meniscal undersurface.

Lateral meniscus:  Intact.

LIGAMENTS

Cruciates:  The ACL is completely torn.  The PCL is intact.

Collaterals:  Intact.

CARTILAGE

Patellofemoral:  Preserved.

Medial:  Reserved.

Lateral:  Preserved.

Joint:  Very small effusion.

Popliteal Fossa:  No Baker's cyst.

Extensor Mechanism:  Intact.

Bones: Mild edema is seen in the posterior aspect of the lateral
tibia consistent with a resolving bone contusion. No fracture.

Other: None.
IMPRESSION: Subacute, complete ACL tear with a resolving bone contusion in the
posterior aspect of the lateral tibia.

Horizontal tear near the junction of the posterior horn and body of
the medial meniscus.

## 2021-09-12 ENCOUNTER — Encounter (HOSPITAL_COMMUNITY): Payer: Self-pay

## 2021-09-12 ENCOUNTER — Ambulatory Visit (HOSPITAL_COMMUNITY)
Admission: EM | Admit: 2021-09-12 | Discharge: 2021-09-12 | Disposition: A | Discharge status: Home / Self Care | Payer: Self-pay | Attending: Family Medicine | Admitting: Family Medicine

## 2021-09-12 ENCOUNTER — Encounter (HOSPITAL_COMMUNITY): Payer: Self-pay | Admitting: Emergency Medicine

## 2021-09-12 ENCOUNTER — Emergency Department (HOSPITAL_COMMUNITY)
Admission: EM | Admit: 2021-09-12 | Discharge: 2021-09-12 | Discharge status: Against Medical Advice | Payer: Self-pay | Attending: Emergency Medicine | Admitting: Emergency Medicine

## 2021-09-12 DIAGNOSIS — H60502 Unspecified acute noninfective otitis externa, left ear: Secondary | ICD-10-CM

## 2021-09-12 DIAGNOSIS — H9202 Otalgia, left ear: Secondary | ICD-10-CM | POA: Insufficient documentation

## 2021-09-12 DIAGNOSIS — Z5321 Procedure and treatment not carried out due to patient leaving prior to being seen by health care provider: Secondary | ICD-10-CM | POA: Insufficient documentation

## 2021-09-12 MED ORDER — KETOROLAC TROMETHAMINE 30 MG/ML IJ SOLN
30.0000 mg | Freq: Once | INTRAMUSCULAR | Status: AC
  Administered 2021-09-12: 30 mg via INTRAMUSCULAR

## 2021-09-12 MED ORDER — KETOROLAC TROMETHAMINE 30 MG/ML IJ SOLN
INTRAMUSCULAR | Status: AC
  Filled 2021-09-12: qty 1

## 2021-09-12 MED ORDER — CEPHALEXIN 500 MG PO CAPS: 500.0000 mg | ORAL_CAPSULE | Freq: Three times a day (TID) | ORAL | 0 refills | Status: AC

## 2021-09-12 MED ORDER — NEOMYCIN-POLYMYXIN-HC 3.5-10000-1 OT SOLN: 4.0000 [drp] | Freq: Four times a day (QID) | OTIC | 0 refills | Status: AC

## 2021-09-12 NOTE — ED Triage Notes (Signed)
Left ear pain for 4 days, onset of draining on the 3rd day. Pain radiating down into the jaw. Ear fullness/hearing loss in the left.   Patient took ibuprofen around 1 pm.

## 2021-09-12 NOTE — ED Provider Notes (Signed)
[facility]    CSN: [id number] Arrival date & time: 09/12/21  1750      History   Chief Complaint Chief Complaint  Patient presents with   Otalgia   Ear Fullness    HPI Larry Le is a 31 y.o. male.    Otalgia Ear Fullness   Here for left ear pain.  It began on August 19.  And then August 20 it began draining some clear fluid.  Right before this started bothering him, he had itched in his ear and used Q-tips to extract some earwax.  No cough or cold symptoms.  He did have some subjective fever earlier today for which she took some ibuprofen.  Is allergic to penicillin which causes a rash.  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 Authorizing Provider  cephALEXin (KEFLEX) 500 MG capsule Take 1 capsule (500 mg total) by mouth 3 (three) times daily for 7 days. 09/12/21 09/19/21 Yes Braycen Burandt, Janace Aris, MD  neomycin-polymyxin-hydrocortisone (CORTISPORIN) OTIC solution Place 4 drops into the right ear 4 (four) times daily for 7 days. 09/12/21 09/19/21 Yes Zenia Resides, MD    Family History History reviewed. No pertinent family history.  Social History Social History    Tobacco Use   Smoking status: Former    Types: Cigarettes   Smokeless tobacco: Never  Vaping Use   Vaping Use: Never used  Substance Use Topics   Alcohol use: Yes    Comment: occ   Drug use: Never     Allergies   Penicillins   Review of Systems Review of Systems  HENT:  Positive for ear pain.      Physical Exam Triage Vital Signs ED Triage Vitals  Enc Vitals Group     BP 09/12/21 1802 128/84     Pulse Rate 09/12/21 1802 68     Resp 09/12/21 1802 16     Temp 09/12/21 1802 98.3 F (36.8 C)     Temp Source 09/12/21 1802 Oral     SpO2 09/12/21 1802 100 %     Weight 09/12/21 1805 232 lb (105.2 kg)     Height 09/12/21 1805 5\' 11"  (1.803 m)     Head Circumference --      Peak Flow --      Pain Score 09/12/21 1805 9     Pain Loc --      Pain Edu? --      Excl. in GC? --    No data found.  Updated Vital Signs BP 128/84 (BP Location: Left Arm)   Pulse 68   Temp 98.3 F (36.8 C) (Oral)   Resp 16   Ht 5\' 11"  (1.803 m)  Wt 105.2 kg   SpO2 100%   BMI 32.36 kg/m   Visual Acuity Right Eye Distance:   Left Eye Distance:   Bilateral Distance:    Right Eye Near:   Left Eye Near:    Bilateral Near:     Physical Exam Vitals reviewed.  Constitutional:      General: He is not in acute distress.    Appearance: He is not toxic-appearing.  HENT:     Right Ear: Tympanic membrane and ear canal normal.     Ears:     Comments: There is serous drainage in the left pinna.  The canals walls are swollen.  What I can see of the tympanic membrane is gray and dull.  He is tender anterior to his pinna also.    Nose: Nose normal.     Mouth/Throat:     Mouth: Mucous membranes are moist.     Pharynx: No oropharyngeal exudate or posterior oropharyngeal erythema.  Eyes:     Extraocular Movements: Extraocular movements intact.     Conjunctiva/sclera: Conjunctivae normal.     Pupils: Pupils are equal, round, and reactive to light.  Cardiovascular:     Rate and Rhythm:  Normal rate and regular rhythm.     Heart sounds: No murmur heard. Pulmonary:     Effort: Pulmonary effort is normal.     Breath sounds: Normal breath sounds.  Musculoskeletal:     Cervical back: Neck supple.  Lymphadenopathy:     Cervical: No cervical adenopathy.  Skin:    Capillary Refill: Capillary refill takes less than 2 seconds.     Coloration: Skin is not jaundiced or pale.  Neurological:     General: No focal deficit present.     Mental Status: He is alert and oriented to person, place, and time.  Psychiatric:        Behavior: Behavior normal.      UC Treatments / Results  Labs (all labs ordered are listed, but only abnormal results are displayed) Labs Reviewed - No data to display  EKG   Radiology No results found.  Procedures Procedures (including critical care time)  Medications Ordered in UC Medications  ketorolac (TORADOL) 30 MG/ML injection 30 mg (has no administration in time range)    Initial Impression / Assessment and Plan / UC Course  I have reviewed the triage vital signs and the nursing notes.  Pertinent labs & imaging results that were available during my care of the patient were reviewed by me and considered in my medical decision making (see chart for details).     I am going to treat for sternal otitis and also for possible cellulitis around his ear. Final Clinical Impressions(s) / UC Diagnoses   Final diagnoses:  Acute otitis externa of left ear, unspecified type     Discharge Instructions      You have been given a shot of Toradol 30 mg today.  Take cephalexin 500 mg--1 capsule 3 times daily for 7 days  Put Cortisporin eardrops in the affected ear 4 times a day for 7 days      ED Prescriptions     Medication Sig Dispense Auth. Provider   neomycin-polymyxin-hydrocortisone (CORTISPORIN) OTIC solution Place 4 drops into the right ear 4 (four) times daily for 7 days. 10 mL Zenia Resides, MD   cephALEXin (KEFLEX) 500  MG capsule Take 1 capsule (500 mg total) by mouth 3 (three) times daily for 7 days. 21 capsule Zenia Resides,  MD      PDMP not reviewed this encounter.   Zenia Resides, MD 09/12/21 [phone]

## 2021-09-12 NOTE — ED Triage Notes (Signed)
Patient here with complaint of left otalgia that started three days ago. Patient reports drainage that started two days ago. Patient is alert, afebrile, and in no apparent distress at this time.

## 2021-09-12 NOTE — Care Management (Signed)
  [facility] Medication Assistance Card Name:  Rhone Ozaki ID (MRN):  [id number] Bin: [id number] RX Group: [id number] Discharge Date: 09/12/2021 Expiration Date: 09/23/2021                                           (must be filled within 7 days of discharge)       Dear   :  Larry Le  You have been approved to have the prescriptions written by your discharging physician filled through our [facility] (Medication Assistance Through [facility]) program. This program allows for a one-time (no refills) 34-day supply of selected medications for a low copay amount.  The copay is $3.00 per prescription. For instance, if you have one prescription, you will pay $3.00; for two prescriptions, you pay $6.00; for three prescriptions, you pay $9.00; and so on.  Only certain pharmacies are participating in this program with [facility]. You will need to select one of the pharmacies from the attached list and take your prescriptions, this letter, and your photo ID to one of the participating pharmacies.   We are excited that you are able to use the [facility] program to get your medications. These prescriptions must be filled within 7 days of hospital discharge or they will no longer be valid for the [employer] program. Should you have any problems with your prescriptions please contact your case management team member at [phone] for [place]/ [facility].  Thank you, [facility]

## 2021-09-12 NOTE — Discharge Instructions (Addendum)
You have been given a shot of Toradol 30 mg today.  Take cephalexin 500 mg--1 capsule 3 times daily for 7 days  Put Cortisporin eardrops in the affected ear 4 times a day for 7 days

## 2021-09-12 NOTE — ED Provider Triage Note (Signed)
Emergency Medicine Provider Triage Evaluation Note  Larry Le , a 31 y.o. male  was evaluated in triage.  Pt complains of left otalgia and drainage x3 days. No fever or chills.   Review of Systems  Positive: otalgia Negative: fever  Physical Exam  BP 130/79   Pulse 80   Temp 98.7 F (37.1 C) (Oral)   Resp 18   SpO2 100%  Gen:   Awake, no distress   Resp:  Normal effort  MSK:   Moves extremities without difficulty  Other:  Drainage from left ear  Medical Decision Making  Medically screening exam initiated at 4:17 PM.  Appropriate orders placed.  Larry Le was informed that the remainder of the evaluation will be completed by another provider, this initial triage assessment does not replace that evaluation, and the importance of remaining in the ED until their evaluation is complete.  Attempted to discharge patient from triage with antibiotics for ear infection; however patient notes he is unable to afford any medications until he gets paid on Friday. Consult to social work placed.    Mannie Stabile, [place] 09/12/21 1623
